# Patient Record
Sex: Female | Born: 1985 | Race: White | Hispanic: No | Marital: Single | Smoking: Never smoker
Health system: Southern US, Community
[De-identification: ages and names within clinical notes are randomized; demographics above are authoritative.]

## PROBLEM LIST (undated history)

## (undated) DIAGNOSIS — G43909 Migraine, unspecified, not intractable, without status migrainosus: Secondary | ICD-10-CM

## (undated) DIAGNOSIS — M5136 Other intervertebral disc degeneration, lumbar region: Secondary | ICD-10-CM

## (undated) DIAGNOSIS — M51369 Other intervertebral disc degeneration, lumbar region without mention of lumbar back pain or lower extremity pain: Secondary | ICD-10-CM

## (undated) HISTORY — PX: TYMPANOSTOMY TUBE PLACEMENT: SHX32

---

## 2012-10-12 ENCOUNTER — Encounter (HOSPITAL_COMMUNITY): Payer: Self-pay | Admitting: *Deleted

## 2012-10-12 ENCOUNTER — Emergency Department (HOSPITAL_COMMUNITY)
Admission: EM | Admit: 2012-10-12 | Discharge: 2012-10-12 | Disposition: A | Discharge status: Home / Self Care | Payer: 59 | Attending: Emergency Medicine | Admitting: Emergency Medicine

## 2012-10-12 ENCOUNTER — Emergency Department (HOSPITAL_COMMUNITY): Payer: 59

## 2012-10-12 DIAGNOSIS — S139XXA Sprain of joints and ligaments of unspecified parts of neck, initial encounter: Secondary | ICD-10-CM | POA: Insufficient documentation

## 2012-10-12 DIAGNOSIS — Z8679 Personal history of other diseases of the circulatory system: Secondary | ICD-10-CM | POA: Insufficient documentation

## 2012-10-12 DIAGNOSIS — R42 Dizziness and giddiness: Secondary | ICD-10-CM | POA: Insufficient documentation

## 2012-10-12 DIAGNOSIS — Y9389 Activity, other specified: Secondary | ICD-10-CM | POA: Insufficient documentation

## 2012-10-12 DIAGNOSIS — R112 Nausea with vomiting, unspecified: Secondary | ICD-10-CM | POA: Insufficient documentation

## 2012-10-12 DIAGNOSIS — Y9289 Other specified places as the place of occurrence of the external cause: Secondary | ICD-10-CM | POA: Insufficient documentation

## 2012-10-12 DIAGNOSIS — S0993XA Unspecified injury of face, initial encounter: Secondary | ICD-10-CM | POA: Insufficient documentation

## 2012-10-12 DIAGNOSIS — S0990XA Unspecified injury of head, initial encounter: Secondary | ICD-10-CM

## 2012-10-12 DIAGNOSIS — R0602 Shortness of breath: Secondary | ICD-10-CM | POA: Insufficient documentation

## 2012-10-12 DIAGNOSIS — W1809XA Striking against other object with subsequent fall, initial encounter: Secondary | ICD-10-CM | POA: Insufficient documentation

## 2012-10-12 DIAGNOSIS — S161XXA Strain of muscle, fascia and tendon at neck level, initial encounter: Secondary | ICD-10-CM

## 2012-10-12 HISTORY — DX: Migraine, unspecified, not intractable, without status migrainosus: G43.909

## 2012-10-12 MED ORDER — METOCLOPRAMIDE HCL 10 MG PO TABS
10.0000 mg | ORAL_TABLET | Freq: Once | ORAL | Status: AC
  Administered 2012-10-12: 10 mg via ORAL
  Filled 2012-10-12: qty 1

## 2012-10-12 MED ORDER — METHOCARBAMOL 500 MG PO TABS: 500.0000 mg | ORAL_TABLET | Freq: Two times a day (BID) | ORAL | Status: AC

## 2012-10-12 MED ORDER — IBUPROFEN 400 MG PO TABS: 400.0000 mg | ORAL_TABLET | Freq: Four times a day (QID) | ORAL | Status: AC | PRN

## 2012-10-12 MED ORDER — IBUPROFEN 200 MG PO TABS
400.0000 mg | ORAL_TABLET | Freq: Once | ORAL | Status: AC
  Administered 2012-10-12: 400 mg via ORAL
  Filled 2012-10-12: qty 2

## 2012-10-12 MED ORDER — METOCLOPRAMIDE HCL 10 MG PO TABS: 10.0000 mg | ORAL_TABLET | Freq: Four times a day (QID) | ORAL | Status: AC | PRN

## 2012-10-12 MED ORDER — TRAMADOL HCL 50 MG PO TABS: 50.0000 mg | ORAL_TABLET | Freq: Four times a day (QID) | ORAL | Status: AC | PRN

## 2012-10-12 NOTE — ED Provider Notes (Signed)
History     CSN: [id number]  Arrival date & time 10/12/12  1716   First MD Initiated Contact with Patient 10/12/12 1758      Chief Complaint  Patient presents with  . Fall    (Consider location/radiation/quality/duration/timing/severity/associated sxs/prior treatment) HPI Pt slipped on ice and fell from standing striking the back of her head. No LOC. Since the fall she has been nauseated and vomited once. +dizziness. +posterior neck pain. No focal weakness or numbness.  Past Medical History  Diagnosis Date  . Migraine     Past Surgical History  Procedure Laterality Date  . Tympanostomy tube placement      History reviewed. No pertinent family history.  History  Substance Use Topics  . Smoking status: Not on file  . Smokeless tobacco: Not on file  . Alcohol Use: No    OB History   Grav Para Term Preterm Abortions TAB SAB Ect Mult Living                  Review of Systems  Constitutional: Negative for fever and chills.  HENT: Positive for neck pain. Negative for facial swelling and neck stiffness.   Eyes: Negative for visual disturbance.  Respiratory: Positive for shortness of breath.   Cardiovascular: Negative for chest pain.  Gastrointestinal: Positive for nausea and vomiting. Negative for abdominal pain, diarrhea and constipation.  Musculoskeletal: Positive for myalgias. Negative for back pain.  Skin: Negative for rash and wound.  Neurological: Positive for dizziness, light-headedness and headaches. Negative for syncope, weakness and numbness.  All other systems reviewed and are negative.    Allergies  Review of patient's allergies indicates no known allergies.  Home Medications   Current Outpatient Rx  Name  Route  Sig  Dispense  Refill  . ibuprofen (ADVIL,MOTRIN) 400 MG tablet   Oral   Take 1 tablet (400 mg total) by mouth every 6 (six) hours as needed for pain.   30 tablet   0   . methocarbamol (ROBAXIN) 500 MG tablet   Oral   Take 1 tablet  (500 mg total) by mouth 2 (two) times daily.   20 tablet   0   . metoCLOPramide (REGLAN) 10 MG tablet   Oral   Take 1 tablet (10 mg total) by mouth every 6 (six) hours as needed (nausea/headache).   6 tablet   0   . traMADol (ULTRAM) 50 MG tablet   Oral   Take 1 tablet (50 mg total) by mouth every 6 (six) hours as needed for pain.   15 tablet   0     BP 102/74  Pulse 78  Temp(Src) 98.4 F (36.9 C) (Oral)  Resp 18  Ht 5\' 4"  (1.626 m)  Wt 185 lb (83.915 kg)  BMI 31.74 kg/m2  SpO2 100%  LMP 10/03/2012  Physical Exam  Nursing note and vitals reviewed. Constitutional: She is oriented to person, place, and time. She appears well-developed and well-nourished. No distress.  HENT:  Head: Normocephalic.  Mouth/Throat: Oropharynx is clear and moist.  TTP over posterior scalp without definite deformity or swelling  Eyes: EOM are normal. Pupils are equal, round, and reactive to light.  Neck: Normal range of motion. Neck supple.  TTP over midline cervical spine and left paraspinal muscles  Cardiovascular: Normal rate and regular rhythm.   Pulmonary/Chest: Effort normal and breath sounds normal. No respiratory distress. She has no wheezes. She has no rales.  Abdominal: Soft. Bowel sounds are normal. She exhibits no  mass. There is no tenderness. There is no rebound and no guarding.  Musculoskeletal: Normal range of motion. She exhibits no edema and no tenderness.  Neurological: She is alert and oriented to person, place, and time.  5/5 motor in all ext, sensation intact  Skin: Skin is warm and dry. No rash noted. No erythema.  Psychiatric: She has a normal mood and affect. Her behavior is normal.    ED Course  Procedures (including critical care time)  Labs Reviewed - No data to display Ct Head Wo Contrast  10/12/2012  *RADIOLOGY REPORT*  Clinical Data:  Larey Seat in parking neck, head pain, neck pain, vomiting.  CT HEAD WITHOUT CONTRAST CT CERVICAL SPINE WITHOUT CONTRAST  Technique:   Multidetector CT imaging of the head and cervical spine was performed following the standard protocol without intravenous contrast.  Multiplanar CT image reconstructions of the cervical spine were also generated.  Comparison:   None  CT HEAD  Findings: There is no evidence for acute infarction, intracranial hemorrhage, mass lesion, hydrocephalus, or extra-axial fluid. There is no atrophy or white matter disease.  Calvarium is intact. No acute sinus or mastoid fluid.  IMPRESSION: Negative exam.  CT CERVICAL SPINE  Findings: No visible cervical spine fracture or traumatic subluxation. Anatomic alignment.  No prevertebral soft tissue swelling.  No neck masses.  Lung apices clear.  IMPRESSION: Negative exam.   Original Report Authenticated By: Davonna Belling, M.D.    Ct Cervical Spine Wo Contrast  10/12/2012  *RADIOLOGY REPORT*  Clinical Data:  Larey Seat in parking neck, head pain, neck pain, vomiting.  CT HEAD WITHOUT CONTRAST CT CERVICAL SPINE WITHOUT CONTRAST  Technique:  Multidetector CT imaging of the head and cervical spine was performed following the standard protocol without intravenous contrast.  Multiplanar CT image reconstructions of the cervical spine were also generated.  Comparison:   None  CT HEAD  Findings: There is no evidence for acute infarction, intracranial hemorrhage, mass lesion, hydrocephalus, or extra-axial fluid. There is no atrophy or white matter disease.  Calvarium is intact. No acute sinus or mastoid fluid.  IMPRESSION: Negative exam.  CT CERVICAL SPINE  Findings: No visible cervical spine fracture or traumatic subluxation. Anatomic alignment.  No prevertebral soft tissue swelling.  No neck masses.  Lung apices clear.  IMPRESSION: Negative exam.   Original Report Authenticated By: Davonna Belling, M.D.      1. Closed head injury, initial encounter   2. Cervical strain, initial encounter       MDM   Pt given head injury precautions.        Loren Racer, MD 10/12/12 2116

## 2012-10-12 NOTE — ED Notes (Signed)
MD at bedside. 

## 2012-10-12 NOTE — ED Notes (Signed)
Pt reports slipping on ice in parking deck. Reports hitting head. Pt reports dizziness, HA and vomiting after fall. Denies LOC.

## 2013-11-10 IMAGING — CT CT HEAD W/O CM
4 series · 17 of 30 positions shown, 19 images · non-contrast
Comparison: None

CT HEAD

CLINICAL DATA: Fell in parking neck, head pain, neck pain,
vomiting.

CT HEAD WITHOUT CONTRAST
CT CERVICAL SPINE WITHOUT CONTRAST
TECHNIQUE: Multidetector CT imaging of the head and cervical spine
was performed following the standard protocol without intravenous
contrast.  Multiplanar CT image reconstructions of the cervical
spine were also generated.

[Series 2: head w/o · axial · non-contrast · 0.45mm/px · z∈[-149,-104]mm · 2 of 29 slices shown]
[im 10/29  brain]
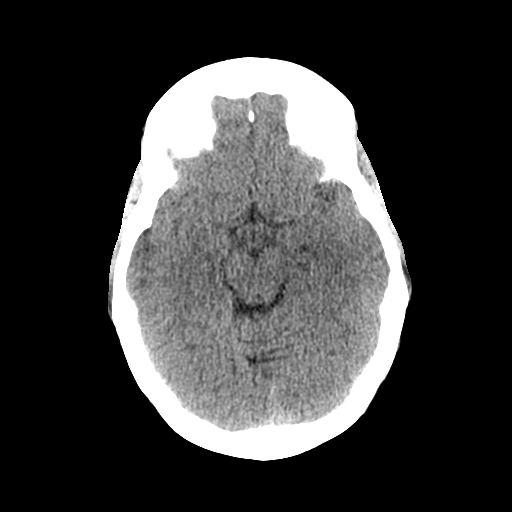
[im 19/29  brain]
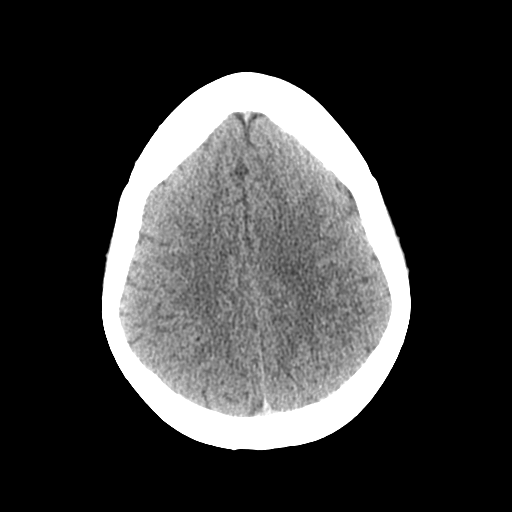

[Series 3: bone windows · axial · 0.45mm/px · z∈[-167,-86]mm · 4 of 47 slices shown]
[im 10/47  bone]
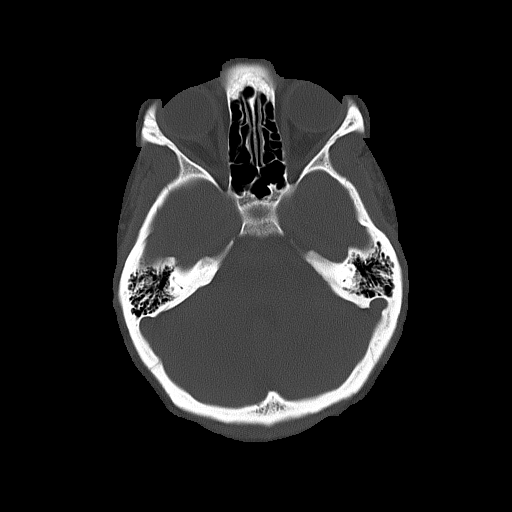
[im 19/47  bone]
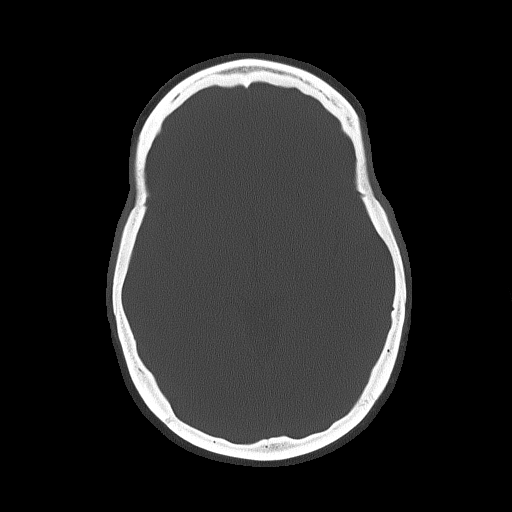
[im 28/47  bone]
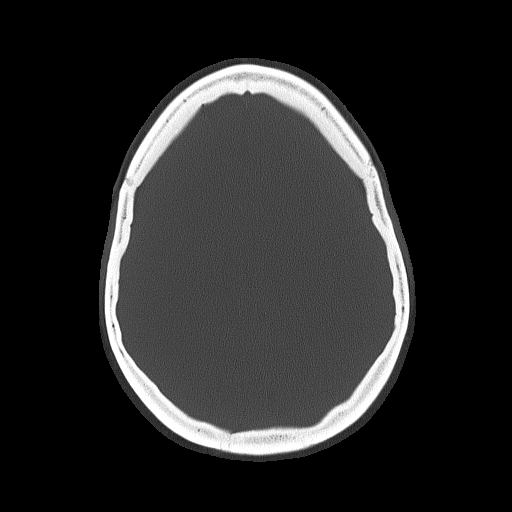
[im 37/47  bone]
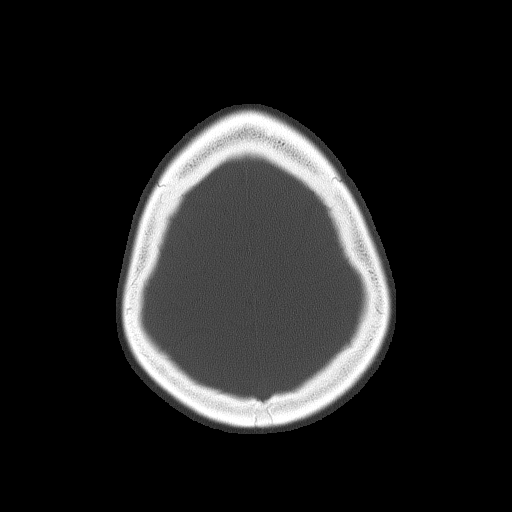

[Series 4: c-spine st · axial · 0.31mm/px · z∈[-318,-286]mm · 3 of 74 slices shown]
[im 9/74  brain]
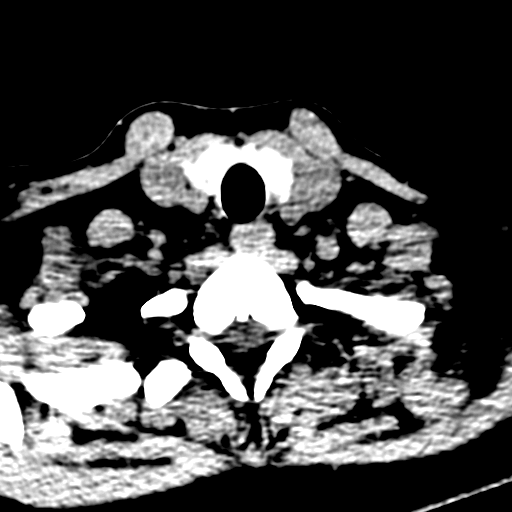
[im 17/74  brain]
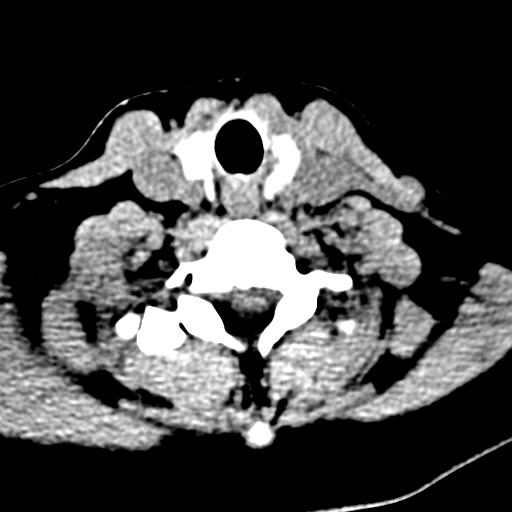
[im 25/74  brain]
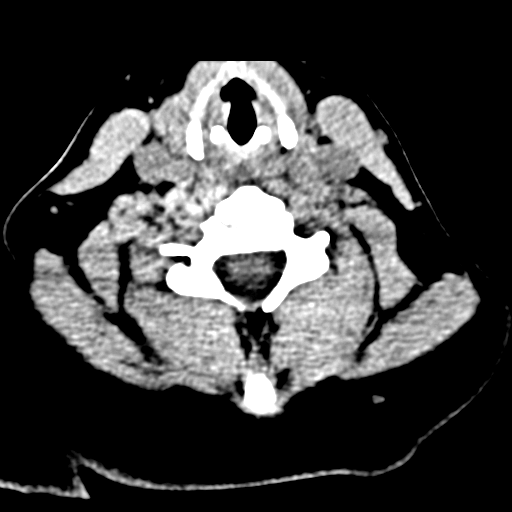

[Series 9: axial recon · axial · 0.23mm/px · z∈[-344,-240]mm · 8 of 74 slices shown, 10 images]
[im 9/74  brain]
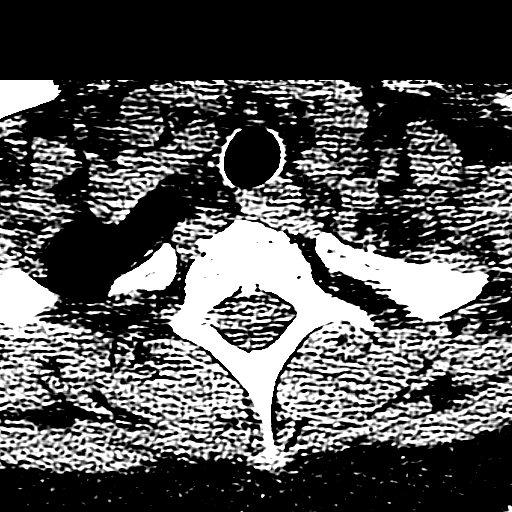
[im 9/74  bone]
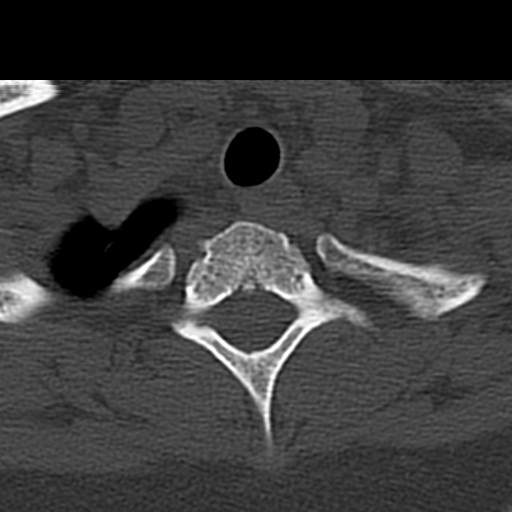
[im 17/74  brain]
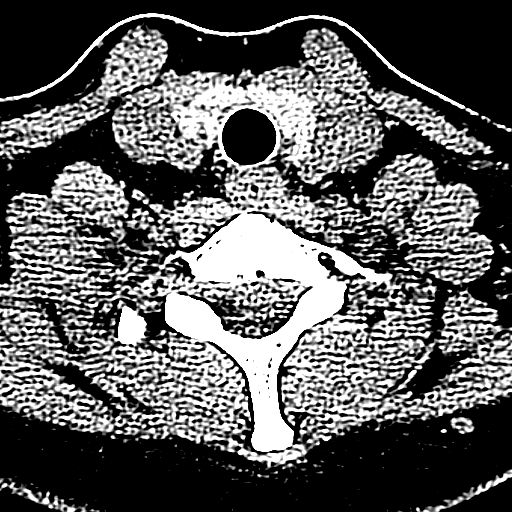
[im 25/74  brain]
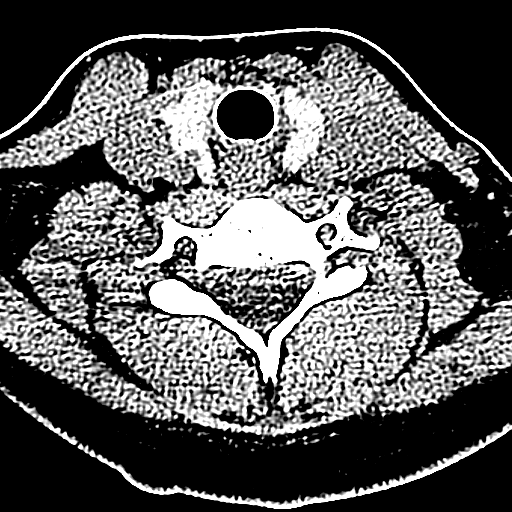
[im 33/74  brain]
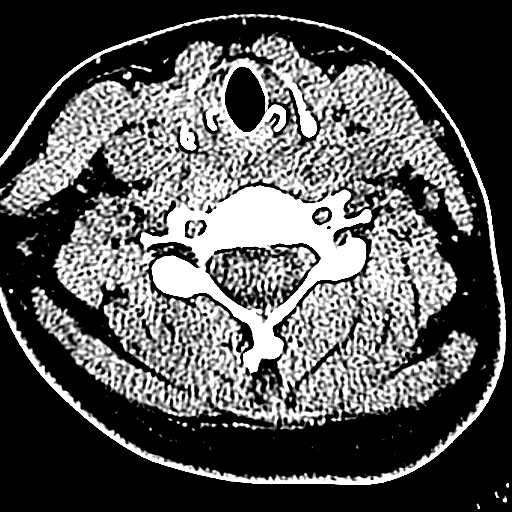
[im 41/74  brain]
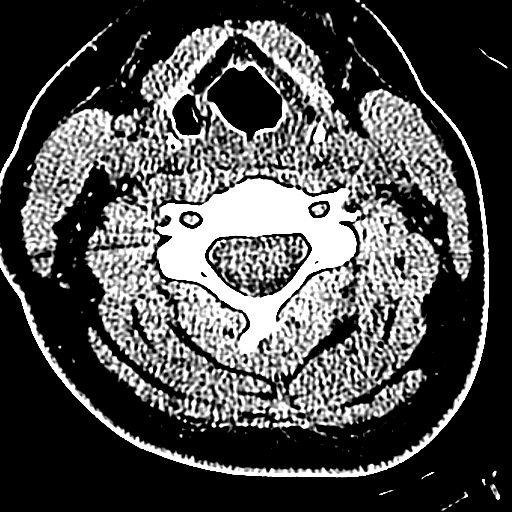
[im 41/74  bone]
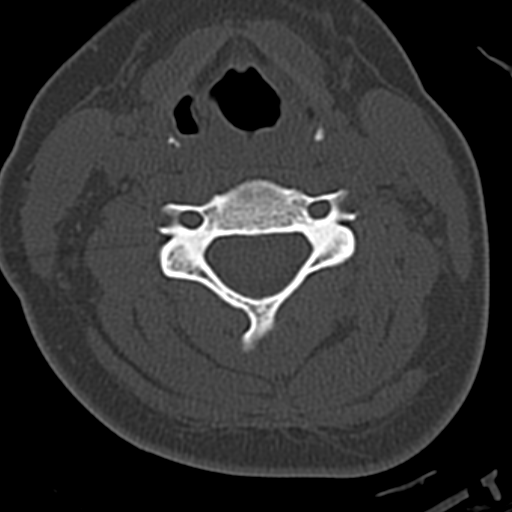
[im 49/74  brain]
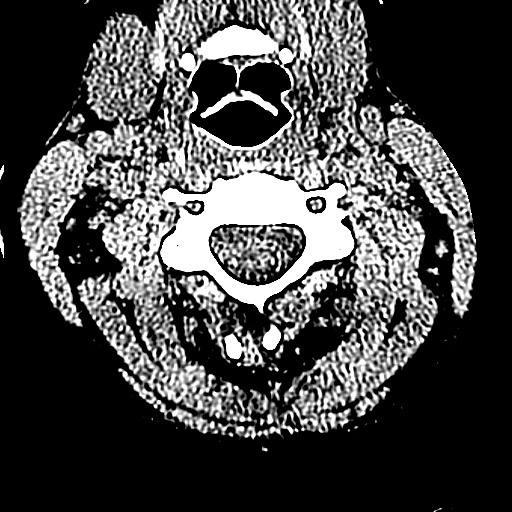
[im 57/74  brain]
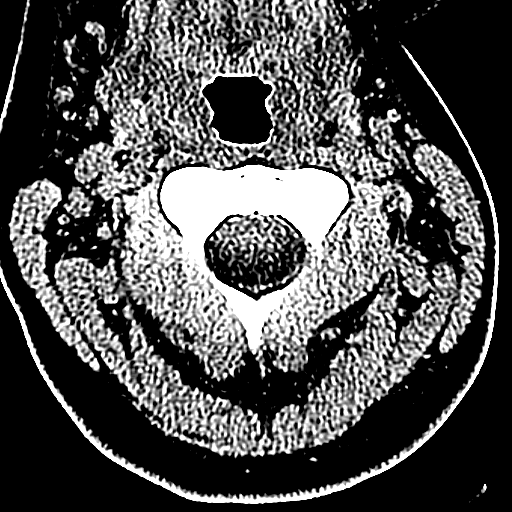
[im 65/74  brain]
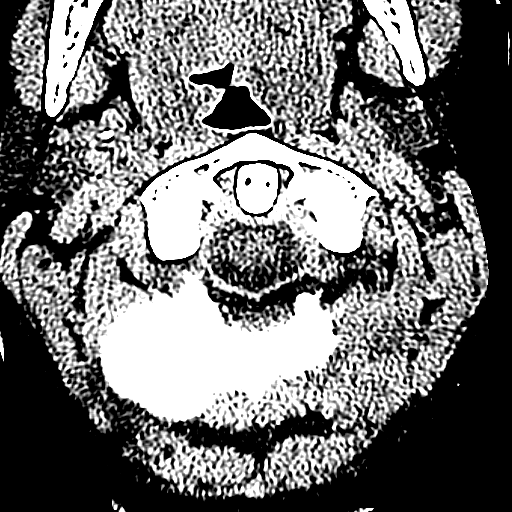

[17 of 30 positions shown; findings below may reference images not displayed]

FINDINGS: There is no evidence for acute infarction, intracranial
hemorrhage, mass lesion, hydrocephalus, or extra-axial fluid.
There is no atrophy or white matter disease.  Calvarium is intact.
No acute sinus or mastoid fluid.
IMPRESSION: Negative exam.

CT CERVICAL SPINE
FINDINGS: No visible cervical spine fracture or traumatic
subluxation. Anatomic alignment.  No prevertebral soft tissue
swelling.  No neck masses.  Lung apices clear.
IMPRESSION: Negative exam.

## 2015-08-22 DIAGNOSIS — F321 Major depressive disorder, single episode, moderate: Secondary | ICD-10-CM | POA: Diagnosis not present

## 2015-09-21 DIAGNOSIS — Z01812 Encounter for preprocedural laboratory examination: Secondary | ICD-10-CM | POA: Diagnosis not present

## 2015-09-21 DIAGNOSIS — Z30017 Encounter for initial prescription of implantable subdermal contraceptive: Secondary | ICD-10-CM | POA: Diagnosis not present

## 2015-09-21 DIAGNOSIS — H20011 Primary iridocyclitis, right eye: Secondary | ICD-10-CM | POA: Diagnosis not present

## 2015-10-19 DIAGNOSIS — K5904 Chronic idiopathic constipation: Secondary | ICD-10-CM | POA: Diagnosis not present

## 2015-11-02 DIAGNOSIS — Z975 Presence of (intrauterine) contraceptive device: Secondary | ICD-10-CM | POA: Diagnosis not present

## 2015-11-06 DIAGNOSIS — K5904 Chronic idiopathic constipation: Secondary | ICD-10-CM | POA: Diagnosis not present

## 2015-12-19 DIAGNOSIS — K5904 Chronic idiopathic constipation: Secondary | ICD-10-CM | POA: Diagnosis not present

## 2016-01-02 DIAGNOSIS — K5904 Chronic idiopathic constipation: Secondary | ICD-10-CM | POA: Diagnosis not present

## 2016-01-14 DIAGNOSIS — K5904 Chronic idiopathic constipation: Secondary | ICD-10-CM | POA: Diagnosis not present

## 2016-05-29 DIAGNOSIS — L719 Rosacea, unspecified: Secondary | ICD-10-CM | POA: Diagnosis not present

## 2016-05-29 DIAGNOSIS — D1801 Hemangioma of skin and subcutaneous tissue: Secondary | ICD-10-CM | POA: Diagnosis not present

## 2016-05-29 DIAGNOSIS — L2082 Flexural eczema: Secondary | ICD-10-CM | POA: Diagnosis not present

## 2016-05-29 DIAGNOSIS — L579 Skin changes due to chronic exposure to nonionizing radiation, unspecified: Secondary | ICD-10-CM | POA: Diagnosis not present

## 2016-06-12 DIAGNOSIS — J019 Acute sinusitis, unspecified: Secondary | ICD-10-CM | POA: Diagnosis not present

## 2016-06-12 DIAGNOSIS — B9689 Other specified bacterial agents as the cause of diseases classified elsewhere: Secondary | ICD-10-CM | POA: Diagnosis not present

## 2016-06-30 DIAGNOSIS — J0100 Acute maxillary sinusitis, unspecified: Secondary | ICD-10-CM | POA: Diagnosis not present

## 2016-06-30 DIAGNOSIS — H6691 Otitis media, unspecified, right ear: Secondary | ICD-10-CM | POA: Diagnosis not present

## 2016-10-03 DIAGNOSIS — M5416 Radiculopathy, lumbar region: Secondary | ICD-10-CM | POA: Diagnosis not present

## 2016-10-09 DIAGNOSIS — Z3046 Encounter for surveillance of implantable subdermal contraceptive: Secondary | ICD-10-CM | POA: Diagnosis not present

## 2016-10-09 DIAGNOSIS — N926 Irregular menstruation, unspecified: Secondary | ICD-10-CM | POA: Diagnosis not present

## 2016-12-25 DIAGNOSIS — G4720 Circadian rhythm sleep disorder, unspecified type: Secondary | ICD-10-CM | POA: Diagnosis not present

## 2017-01-14 DIAGNOSIS — L579 Skin changes due to chronic exposure to nonionizing radiation, unspecified: Secondary | ICD-10-CM | POA: Diagnosis not present

## 2017-01-14 DIAGNOSIS — Q829 Congenital malformation of skin, unspecified: Secondary | ICD-10-CM | POA: Diagnosis not present

## 2017-01-14 DIAGNOSIS — L853 Xerosis cutis: Secondary | ICD-10-CM | POA: Diagnosis not present

## 2017-01-14 DIAGNOSIS — L981 Factitial dermatitis: Secondary | ICD-10-CM | POA: Diagnosis not present

## 2017-01-14 DIAGNOSIS — L2082 Flexural eczema: Secondary | ICD-10-CM | POA: Diagnosis not present

## 2017-05-12 DIAGNOSIS — Z23 Encounter for immunization: Secondary | ICD-10-CM | POA: Diagnosis not present

## 2017-05-29 DIAGNOSIS — M25774 Osteophyte, right foot: Secondary | ICD-10-CM | POA: Diagnosis not present

## 2018-07-26 DIAGNOSIS — R05 Cough: Secondary | ICD-10-CM | POA: Diagnosis not present

## 2018-07-26 DIAGNOSIS — J0100 Acute maxillary sinusitis, unspecified: Secondary | ICD-10-CM | POA: Diagnosis not present

## 2018-08-13 DIAGNOSIS — Z3046 Encounter for surveillance of implantable subdermal contraceptive: Secondary | ICD-10-CM | POA: Diagnosis not present

## 2018-08-20 DIAGNOSIS — J0141 Acute recurrent pansinusitis: Secondary | ICD-10-CM | POA: Diagnosis not present

## 2018-08-20 DIAGNOSIS — M26609 Unspecified temporomandibular joint disorder, unspecified side: Secondary | ICD-10-CM | POA: Diagnosis not present

## 2019-01-26 DIAGNOSIS — M2550 Pain in unspecified joint: Secondary | ICD-10-CM | POA: Diagnosis not present

## 2019-02-11 DIAGNOSIS — M2550 Pain in unspecified joint: Secondary | ICD-10-CM | POA: Diagnosis not present

## 2019-11-21 DIAGNOSIS — H9202 Otalgia, left ear: Secondary | ICD-10-CM | POA: Diagnosis not present

## 2019-11-21 DIAGNOSIS — R3915 Urgency of urination: Secondary | ICD-10-CM | POA: Diagnosis not present

## 2019-11-21 DIAGNOSIS — J359 Chronic disease of tonsils and adenoids, unspecified: Secondary | ICD-10-CM | POA: Diagnosis not present

## 2019-11-21 DIAGNOSIS — N3000 Acute cystitis without hematuria: Secondary | ICD-10-CM | POA: Diagnosis not present

## 2019-12-23 DIAGNOSIS — H93293 Other abnormal auditory perceptions, bilateral: Secondary | ICD-10-CM | POA: Diagnosis not present

## 2019-12-23 DIAGNOSIS — J358 Other chronic diseases of tonsils and adenoids: Secondary | ICD-10-CM | POA: Diagnosis not present

## 2020-05-31 DIAGNOSIS — Z20822 Contact with and (suspected) exposure to covid-19: Secondary | ICD-10-CM | POA: Diagnosis not present

## 2020-05-31 DIAGNOSIS — Z03818 Encounter for observation for suspected exposure to other biological agents ruled out: Secondary | ICD-10-CM | POA: Diagnosis not present

## 2022-01-01 DIAGNOSIS — M5441 Lumbago with sciatica, right side: Secondary | ICD-10-CM | POA: Diagnosis not present

## 2022-01-01 DIAGNOSIS — M533 Sacrococcygeal disorders, not elsewhere classified: Secondary | ICD-10-CM | POA: Diagnosis not present

## 2022-01-01 DIAGNOSIS — M7061 Trochanteric bursitis, right hip: Secondary | ICD-10-CM | POA: Diagnosis not present

## 2022-01-01 DIAGNOSIS — M5442 Lumbago with sciatica, left side: Secondary | ICD-10-CM | POA: Diagnosis not present

## 2022-01-01 DIAGNOSIS — M47816 Spondylosis without myelopathy or radiculopathy, lumbar region: Secondary | ICD-10-CM | POA: Diagnosis not present

## 2022-01-10 DIAGNOSIS — R202 Paresthesia of skin: Secondary | ICD-10-CM | POA: Diagnosis not present

## 2022-01-10 DIAGNOSIS — M5416 Radiculopathy, lumbar region: Secondary | ICD-10-CM | POA: Diagnosis not present

## 2022-01-10 DIAGNOSIS — M47816 Spondylosis without myelopathy or radiculopathy, lumbar region: Secondary | ICD-10-CM | POA: Diagnosis not present

## 2022-02-03 ENCOUNTER — Emergency Department (HOSPITAL_COMMUNITY): Payer: PRIVATE HEALTH INSURANCE

## 2022-02-03 ENCOUNTER — Emergency Department (HOSPITAL_COMMUNITY)
Admission: EM | Admit: 2022-02-03 | Discharge: 2022-02-04 | Disposition: A | Discharge status: Home / Self Care | Payer: PRIVATE HEALTH INSURANCE | Attending: Emergency Medicine | Admitting: Emergency Medicine

## 2022-02-03 ENCOUNTER — Encounter (HOSPITAL_COMMUNITY): Payer: Self-pay

## 2022-02-03 ENCOUNTER — Other Ambulatory Visit: Payer: Self-pay

## 2022-02-03 DIAGNOSIS — S99911A Unspecified injury of right ankle, initial encounter: Secondary | ICD-10-CM | POA: Diagnosis present

## 2022-02-03 DIAGNOSIS — W228XXA Striking against or struck by other objects, initial encounter: Secondary | ICD-10-CM | POA: Diagnosis not present

## 2022-02-03 DIAGNOSIS — S93401A Sprain of unspecified ligament of right ankle, initial encounter: Secondary | ICD-10-CM | POA: Diagnosis not present

## 2022-02-03 HISTORY — DX: Other intervertebral disc degeneration, lumbar region without mention of lumbar back pain or lower extremity pain: M51.369

## 2022-02-03 HISTORY — DX: Other intervertebral disc degeneration, lumbar region: M51.36

## 2022-02-03 MED ORDER — IBUPROFEN 400 MG PO TABS
400.0000 mg | ORAL_TABLET | Freq: Once | ORAL | Status: AC
  Administered 2022-02-04: 400 mg via ORAL
  Filled 2022-02-03: qty 1

## 2022-02-03 NOTE — ED Provider Triage Note (Signed)
Emergency Medicine Provider Triage Evaluation Note  Whitney Vazquez , a 36 y.o. female  was evaluated in triage.  Pt complains of right ankle pain.  She states that around 10 PM tonight she tripped over a chair leg and rolled her right ankle.  Began having swelling afterward.  She complains of moderate pain to the lateral part of the ankle.  No previous surgeries.  No knee pain..  Review of Systems  Positive: See above Negative:   Physical Exam  BP 129/84 (BP Location: Right Arm)   Pulse 80   Temp 98.3 F (36.8 C) (Oral)   Resp 18   Ht '5\' 4"'$  (1.626 m)   Wt 84.8 kg   LMP 01/31/2022 (Exact Date)   SpO2 99%   BMI 32.10 kg/m  Gen:   Awake, no distress   Resp:  Normal effort  MSK:   Moves extremities without difficulty  Other:  Swelling noted to the lateral malleolus, sensation intact, DP pulse 1+.  Cap refill less than 2 seconds.  Able to flex and extend the right ankle without difficulty.  Medical Decision Making  Medically screening exam initiated at 11:08 PM.  Appropriate orders placed.  Shadavia Dampier was informed that the remainder of the evaluation will be completed by another provider, this initial triage assessment does not replace that evaluation, and the importance of remaining in the ED until their evaluation is complete.     Mickie Hillier, PA-C 02/03/22 2309

## 2022-02-03 NOTE — ED Triage Notes (Signed)
Pt reports she tripped over a chair leg, has pain to right ankle. + pedal pulse, sensation, able to wiggle toes. Mild swelling noted to lateral right ankle.

## 2022-02-04 NOTE — Discharge Instructions (Addendum)
You were seen in the emergency department today for ankle swelling.  You do not have a fracture or dislocation.  We provided you with a Aircast brace which you can use for comfort.  Continue to rest, use ice multiple times a day for 15 to 20 minutes at a time, you may use ibuprofen or Tylenol as needed for pain and elevate the leg when you are resting.  Please return to emergency department if you are unable to bear weight.

## 2022-02-04 NOTE — ED Provider Notes (Signed)
[facility] EMERGENCY DEPARTMENT Provider Note   CSN: [id number] Arrival date & time: 02/03/22  2251     History  Chief Complaint  Patient presents with   Ankle Pain    Whitney Vazquez is a 36 y.o. female.  With past medical history of migraines who presents to the emergency department with right ankle pain.  States that she was at home this evening when she hit the corner of a chair with her right ankle.  Believes she also rolled her right ankle but is unsure.  She denies falling.  States that since then she has had progressive swelling of the lateral ankle and pain.  Denies numbness or tingling to the foot.  Denies pain to the knee.  Denies previous surgery on the right ankle.   Ankle Pain      Home Medications Prior to Admission medications   Medication Sig Start Date End Date Taking? Authorizing Provider  ibuprofen (ADVIL,MOTRIN) 400 MG tablet Take 1 tablet (400 mg total) by mouth every 6 (six) hours as needed for pain. 10/12/12   Julianne Rice, MD  methocarbamol (ROBAXIN) 500 MG tablet Take 1 tablet (500 mg total) by mouth 2 (two) times daily. 10/12/12   Julianne Rice, MD  metoCLOPramide (REGLAN) 10 MG tablet Take 1 tablet (10 mg total) by mouth every 6 (six) hours as needed (nausea/headache). 10/12/12   Julianne Rice, MD  traMADol (ULTRAM) 50 MG tablet Take 1 tablet (50 mg total) by mouth every 6 (six) hours as needed for pain. 10/12/12   Julianne Rice, MD      Allergies    Patient has no known allergies.    Review of Systems   Review of Systems  Musculoskeletal:  Positive for arthralgias and joint swelling.  All other systems reviewed and are negative.   Physical Exam Updated Vital Signs BP 129/84 (BP Location: Right Arm)   Pulse 80   Temp 98.3 F (36.8 C) (Oral)   Resp 18   Ht '5\' 4"'$  (1.626 m)   Wt 84.8 kg   LMP 01/31/2022 (Exact Date)   SpO2 99%   BMI 32.10 kg/m  Physical Exam Vitals and nursing note reviewed.  Constitutional:       General: She is not in acute distress.    Appearance: Normal appearance. She is not ill-appearing.  HENT:     Head: Normocephalic and atraumatic.  Eyes:     General: No scleral icterus. Cardiovascular:     Rate and Rhythm: Normal rate and regular rhythm.     Pulses: Normal pulses.  Pulmonary:     Effort: Pulmonary effort is normal.  Musculoskeletal:        General: Swelling, tenderness and signs of injury present. No deformity.       Legs:     Comments: Swelling to the lateral malleolus with ecchymoses DP pulse 1+, sensation intact, cap refill less than 2 seconds  Skin:    General: Skin is warm and dry.     Capillary Refill: Capillary refill takes less than 2 seconds.     Coloration: Skin is not pale.     Findings: Bruising present.  Neurological:     General: No focal deficit present.     Mental Status: She is alert.  Psychiatric:        Mood and Affect: Mood normal.        Behavior: Behavior normal.     ED Results / Procedures / Treatments   Labs (all labs ordered  are listed, but only abnormal results are displayed) Labs Reviewed - No data to display  EKG None  Radiology DG Ankle Complete Right  Result Date: 02/03/2022 CLINICAL DATA:  Right ankle pain and swelling after injury, fall. EXAM: RIGHT ANKLE - COMPLETE 3+ VIEW COMPARISON:  None Available. FINDINGS: There is no evidence of fracture, dislocation, or joint effusion. Ankle mortise is preserved. Intact talar dome. Intact base of the fifth metatarsal. There is no evidence of arthropathy or other focal bone abnormality. Soft tissue edema is most prominent laterally. IMPRESSION: Soft tissue edema. No fracture or dislocation. Electronically Signed   By: Keith Rake M.D.   On: 02/03/2022 23:50    Procedures Procedures   Medications Ordered in ED Medications  ibuprofen (ADVIL) tablet 400 mg (has no administration in time range)    ED Course/ Medical Decision Making/ A&P                            Medical Decision Making Risk Prescription drug management.  This patient presents to the ED for concern of ankle swelling, this involves an extensive number of treatment options, and is a complaint that carries with it a high risk of complications and morbidity.  The differential diagnosis includes fracture, sprain  Co morbidities that complicate the patient evaluation None  Additional history obtained:  Additional history obtained from: None available External records from outside source obtained and reviewed including: Most recent PT note  Imaging Studies ordered:  I ordered imaging studies which included x-ray.  I independently reviewed & interpreted imaging & am in agreement with radiology impression. Imaging shows: X-ray of the right ankle shows soft tissue edema without evidence of fracture or dislocation  Medications  I ordered medication including ibuprofen for pain Reevaluation of the patient after medication shows that patient stayed the same -I reviewed the patient's home medications and did not make adjustments. -I did not prescribe new home medications.   Critical Interventions: Aircast  SDH None identified  ED Course:  36 year old female who presents to the emergency department with right ankle swelling.  Physical exam as noted above.  Likely a ligamentous sprain.  Low suspicion for fracture, dislocation, septic arthritis, gouty flare, arthritis.  Imaging without evidence of fracture or dislocation.  She is weightbearing.  Provided with a Aircast brace.  Instructed to use NSAIDs, rest, ice and elevating.  Given return precautions if unable to bear weight.  She verbalized understanding.  After consideration of the diagnostic results and the patients response to treatment, I feel that the patent would benefit from discharge. The patient has been appropriately medically screened and/or stabilized in the ED. I have low suspicion for any other emergent medical  condition which would require further screening, evaluation or treatment in the ED or require inpatient management. The patient is overall well appearing and non-toxic in appearance. They are hemodynamically stable at time of discharge.   Final Clinical Impression(s) / ED Diagnoses Final diagnoses:  Sprain of right ankle, unspecified ligament, initial encounter    Rx / DC Orders ED Discharge Orders     None         Mickie Hillier, PA-C 02/04/22 0050    Dina Rich Barbette Hair, MD 02/04/22 [phone]

## 2022-02-04 NOTE — Progress Notes (Signed)
Orthopedic Tech Progress Note Patient Details:  Whitney Vazquez 1985/12/07 [id number]  Ortho Devices Type of Ortho Device: Ankle Air splint Ortho Device/Splint Location: rle Ortho Device/Splint Interventions: Ordered, Application, Adjustment   Post Interventions Patient Tolerated: Well Instructions Provided: Care of device, Adjustment of device  Karolee Stamps 02/04/2022, 12:56 AM

## 2022-02-11 ENCOUNTER — Ambulatory Visit: Payer: Self-pay

## 2022-02-11 ENCOUNTER — Other Ambulatory Visit: Payer: Self-pay | Admitting: Family Medicine

## 2022-02-11 DIAGNOSIS — M25571 Pain in right ankle and joints of right foot: Secondary | ICD-10-CM

## 2022-02-11 DIAGNOSIS — M79671 Pain in right foot: Secondary | ICD-10-CM

## 2022-02-13 DIAGNOSIS — M461 Sacroiliitis, not elsewhere classified: Secondary | ICD-10-CM | POA: Diagnosis not present

## 2022-02-13 DIAGNOSIS — M545 Low back pain, unspecified: Secondary | ICD-10-CM | POA: Diagnosis not present

## 2022-02-13 DIAGNOSIS — M7061 Trochanteric bursitis, right hip: Secondary | ICD-10-CM | POA: Diagnosis not present

## 2022-02-13 DIAGNOSIS — G8929 Other chronic pain: Secondary | ICD-10-CM | POA: Diagnosis not present

## 2022-02-13 DIAGNOSIS — M47816 Spondylosis without myelopathy or radiculopathy, lumbar region: Secondary | ICD-10-CM | POA: Diagnosis not present

## 2022-03-27 DIAGNOSIS — E789 Disorder of lipoprotein metabolism, unspecified: Secondary | ICD-10-CM | POA: Diagnosis not present

## 2022-03-27 DIAGNOSIS — Z79899 Other long term (current) drug therapy: Secondary | ICD-10-CM | POA: Diagnosis not present

## 2022-03-27 DIAGNOSIS — Z13220 Encounter for screening for lipoid disorders: Secondary | ICD-10-CM | POA: Diagnosis not present

## 2022-03-27 DIAGNOSIS — Z1329 Encounter for screening for other suspected endocrine disorder: Secondary | ICD-10-CM | POA: Diagnosis not present

## 2022-03-27 DIAGNOSIS — Z130 Encounter for screening for diseases of the blood and blood-forming organs and certain disorders involving the immune mechanism: Secondary | ICD-10-CM | POA: Diagnosis not present

## 2022-03-27 DIAGNOSIS — Z0000 Encounter for general adult medical examination without abnormal findings: Secondary | ICD-10-CM | POA: Diagnosis not present

## 2022-03-27 DIAGNOSIS — Z13228 Encounter for screening for other metabolic disorders: Secondary | ICD-10-CM | POA: Diagnosis not present

## 2022-03-28 ENCOUNTER — Other Ambulatory Visit: Payer: Self-pay

## 2022-03-28 ENCOUNTER — Encounter (HOSPITAL_BASED_OUTPATIENT_CLINIC_OR_DEPARTMENT_OTHER): Payer: Self-pay

## 2022-03-28 ENCOUNTER — Emergency Department (HOSPITAL_BASED_OUTPATIENT_CLINIC_OR_DEPARTMENT_OTHER): Payer: PRIVATE HEALTH INSURANCE

## 2022-03-28 ENCOUNTER — Emergency Department (HOSPITAL_BASED_OUTPATIENT_CLINIC_OR_DEPARTMENT_OTHER)
Admission: EM | Admit: 2022-03-28 | Discharge: 2022-03-28 | Disposition: A | Discharge status: Home / Self Care | Payer: PRIVATE HEALTH INSURANCE | Attending: Emergency Medicine | Admitting: Emergency Medicine

## 2022-03-28 DIAGNOSIS — S0990XA Unspecified injury of head, initial encounter: Secondary | ICD-10-CM | POA: Insufficient documentation

## 2022-03-28 DIAGNOSIS — W010XXA Fall on same level from slipping, tripping and stumbling without subsequent striking against object, initial encounter: Secondary | ICD-10-CM | POA: Insufficient documentation

## 2022-03-28 DIAGNOSIS — Y990 Civilian activity done for income or pay: Secondary | ICD-10-CM | POA: Diagnosis not present

## 2022-03-28 NOTE — ED Triage Notes (Signed)
Patient here POV from Home.  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   Not Evaluated Then but still endorses Headaches and Swelling to Head. Mild Nausea Intermittently. No Photosensitivity.   No LOC. No Anticoagulants. Referred by [facility] for [place].   NAD Noted during Triage. A&Ox4. GCS 15. Ambulatory.

## 2022-03-28 NOTE — Discharge Instructions (Signed)
You were seen in the emergency room for evaluation of your recurring headaches after sudden fall and head injury.  Your CT was unremarkable.  I likely think you are experiencing some postconcussive syndrome.  I would like for you to follow-up with concussion clinic.  The information is listed on this discharge paperwork.  Please call to schedule an appointment.  Otherwise, please follow-up with your Worker's Comp. provider for additional instructions.  You can take Tylenol or ibuprofen as needed for pain.  You have any concern, new or worsening symptoms, please return to the nearest emergency department for evaluation.  Get help right away if: You have: A very bad headache that is not helped by medicine. Trouble walking or weakness in your arms and legs. Clear or bloody fluid coming from your nose or ears. Changes in how you see (vision). A seizure. More confusion or more grumpy moods. Your symptoms get worse. You are sleepier than normal and have trouble staying awake. You lose your balance. The black centers of your eyes (pupils) change in size. Your speech is slurred. Your dizziness gets worse. You vomit. These symptoms may be an emergency. Do not wait to see if the symptoms will go away. Get medical help right away. Call your local emergency services (911 in the [place] Do not drive yourself to the hospital.

## 2022-03-28 NOTE — ED Provider Notes (Signed)
[facility] EMERGENCY DEPT Provider Note   CSN: [id number] Arrival date & time: 03/28/22  1436     History Chief Complaint  Patient presents with   Head Injury    Whitney Vazquez is a 36 y.o. female with h/o DDD presents to the ED for evaluation of continued head pain after a trip and fall at work. On August 3rd, the patient reports that she tripped hit the left side of her face on the ground. Since then, she has continued to have recurring headaches.  She reports some occasional lightheadedness/dizziness around once a week.  Denies any new falls or injuries.  She reports she still has some swelling above her left eye.  Denies any visual changes.   Head Injury Associated symptoms: headache   Associated symptoms: no nausea and no vomiting        Home Medications Prior to Admission medications   Medication Sig Start Date End Date Taking? Authorizing Provider  ibuprofen (ADVIL,MOTRIN) 400 MG tablet Take 1 tablet (400 mg total) by mouth every 6 (six) hours as needed for pain. 10/12/12   Julianne Rice, MD  methocarbamol (ROBAXIN) 500 MG tablet Take 1 tablet (500 mg total) by mouth 2 (two) times daily. 10/12/12   Julianne Rice, MD  metoCLOPramide (REGLAN) 10 MG tablet Take 1 tablet (10 mg total) by mouth every 6 (six) hours as needed (nausea/headache). 10/12/12   Julianne Rice, MD  traMADol (ULTRAM) 50 MG tablet Take 1 tablet (50 mg total) by mouth every 6 (six) hours as needed for pain. 10/12/12   Julianne Rice, MD      Allergies    Patient has no known allergies.    Review of Systems   Review of Systems  Constitutional:  Negative for chills and fever.  HENT:  Positive for facial swelling.   Eyes:  Negative for photophobia and visual disturbance.  Gastrointestinal:  Negative for nausea and vomiting.  Neurological:  Positive for dizziness, light-headedness and headaches. Negative for syncope and weakness.    Physical Exam Updated Vital Signs BP (!) 112/59  (BP Location: Right Arm)   Pulse 79   Temp 98.3 F (36.8 C) (Oral)   Resp 16   Ht '5\' 4"'$  (1.626 m)   Wt 84.8 kg   SpO2 100%   BMI 32.09 kg/m  Physical Exam Vitals and nursing note reviewed.  Constitutional:      General: She is not in acute distress.    Appearance: Normal appearance. She is not ill-appearing or toxic-appearing.  HENT:     Head: Normocephalic.     Comments: Patient does have some mild swelling noted above her left eyebrow.  No step-offs or deformities.  No overlying skin changes or bruising noted.  No raccoon eyes or battle signs.    Right Ear: Tympanic membrane, ear canal and external ear normal.     Left Ear: Tympanic membrane, ear canal and external ear normal.  Eyes:     General: No scleral icterus. Cardiovascular:     Rate and Rhythm: Normal rate and regular rhythm.  Pulmonary:     Effort: Pulmonary effort is normal.     Breath sounds: Normal breath sounds.  Abdominal:     General: Abdomen is flat. Bowel sounds are normal.     Palpations: Abdomen is soft.  Musculoskeletal:        General: No deformity.     Cervical back: Normal range of motion. No tenderness.  Skin:    General: Skin is warm and  dry.  Neurological:     General: No focal deficit present.     Mental Status: She is alert. Mental status is at baseline.     GCS: GCS eye subscore is 4. GCS verbal subscore is 5. GCS motor subscore is 6.     Cranial Nerves: No cranial nerve deficit, dysarthria or facial asymmetry.     Sensory: No sensory deficit.     Motor: No weakness or pronator drift.     Coordination: Finger-Nose-Finger Test normal.     Gait: Gait normal.     Comments: GCS 15.  Patient answering questions appropriately with appropriate speech.  Cranial nerves II through XII grossly intact.  Strength is equal in patient's upper and lower bilateral extremities.  Sensation intact.  No pronator drift.  Normal finger-nose.  Normal gait.     ED Results / Procedures / Treatments   Labs (all  labs ordered are listed, but only abnormal results are displayed) Labs Reviewed - No data to display  EKG None  Radiology CT Head Wo Contrast  Result Date: 03/28/2022 CLINICAL DATA:  Fall, head injury EXAM: CT HEAD WITHOUT CONTRAST TECHNIQUE: Contiguous axial images were obtained from the base of the skull through the vertex without intravenous contrast. RADIATION DOSE REDUCTION: This exam was performed according to the departmental dose-optimization program which includes automated exposure control, adjustment of the mA and/or kV according to patient size and/or use of iterative reconstruction technique. COMPARISON:  10/12/2012 FINDINGS: Brain: No evidence of acute infarction, hemorrhage, hydrocephalus, extra-axial collection or mass lesion/mass effect. Vascular: No hyperdense vessel or unexpected calcification. Skull: Normal. Negative for fracture or focal lesion. Sinuses/Orbits: No acute finding. Other: None. IMPRESSION: No acute intracranial pathology. Electronically Signed   By: Delanna Ahmadi M.D.   On: 03/28/2022 15:07    Procedures Procedures   Medications Ordered in ED Medications - No data to display  ED Course/ Medical Decision Making/ A&P                           Medical Decision Making Amount and/or Complexity of Data Reviewed Radiology: ordered.   36 year old female presents the emergency department for evaluation of continued intermittent head pain for the past month after a mechanical fall and landing on the left side of her head.  Differential diagnosis includes but is not limited to bleed, fracture, trauma, postconcussive syndrome.  Vital signs are unremarkable.  Physical exam as noted above.  CT imaging ordered in triage.    CT imaging shows No acute intracranial pathology.  No acute fracture of the sinus or orbits.  I did call this with the radiologist who confirms that there is no fracture seen in the left supraorbital region.  The patient has a benign neurological  exam. With the injury being over a month ago with benign CT imaging. The patient is safe for close outpatient follow up.  I discussed the imaging with the patient.  Recommended that she follow-up with her Worker's Comp./health network providers for reevaluation and possible outpatient MRI.  We will have her follow-up with concussion clinic. Recommended Tylenol and ibuprofen as needed for pain.   We discussed return precautions and red flag symptoms.  Patient verbalized understanding agrees to the plan.  Patient is stable being discharged home in good condition.  I discussed this case with my attending physician who cosigned this note including patient's presenting symptoms, physical exam, and planned diagnostics and interventions. Attending physician stated agreement with plan  or made changes to plan which were implemented.   Attending physician assessed patient at bedside.  Final Clinical Impression(s) / ED Diagnoses Final diagnoses:  Injury of head, initial encounter    Rx / DC Orders ED Discharge Orders     None         Sherrell Puller, PA-C 03/31/22 1121    Fransico Meadow, MD 04/03/22 1110

## 2022-03-28 NOTE — ED Notes (Signed)
RT note: Pt. up for d/c, v/s obtained/updated in chart.

## 2022-07-03 DIAGNOSIS — M461 Sacroiliitis, not elsewhere classified: Secondary | ICD-10-CM | POA: Diagnosis not present

## 2022-07-03 DIAGNOSIS — M533 Sacrococcygeal disorders, not elsewhere classified: Secondary | ICD-10-CM | POA: Diagnosis not present

## 2022-07-03 DIAGNOSIS — M545 Low back pain, unspecified: Secondary | ICD-10-CM | POA: Diagnosis not present

## 2022-07-03 DIAGNOSIS — G8929 Other chronic pain: Secondary | ICD-10-CM | POA: Diagnosis not present

## 2023-05-07 ENCOUNTER — Other Ambulatory Visit: Payer: Self-pay | Admitting: Medical Genetics

## 2023-05-07 DIAGNOSIS — Z006 Encounter for examination for normal comparison and control in clinical research program: Secondary | ICD-10-CM

## 2023-07-01 ENCOUNTER — Ambulatory Visit: Payer: Self-pay

## 2023-07-02 ENCOUNTER — Other Ambulatory Visit (HOSPITAL_COMMUNITY): Payer: Self-pay

## 2023-07-02 DIAGNOSIS — R051 Acute cough: Secondary | ICD-10-CM | POA: Diagnosis not present

## 2023-07-02 DIAGNOSIS — J069 Acute upper respiratory infection, unspecified: Secondary | ICD-10-CM | POA: Diagnosis not present

## 2023-07-02 MED ORDER — BENZONATATE 100 MG PO CAPS
100.0000 mg | ORAL_CAPSULE | Freq: Three times a day (TID) | ORAL | 0 refills | Status: AC | PRN
  Filled 2023-07-02: qty 21, 7d supply, fill #0

## 2023-07-02 MED ORDER — AMOXICILLIN-POT CLAVULANATE 875-125 MG PO TABS
1.0000 | ORAL_TABLET | Freq: Two times a day (BID) | ORAL | 0 refills | Status: AC
  Filled 2023-07-02: qty 14, 7d supply, fill #0

## 2024-03-01 DIAGNOSIS — Z23 Encounter for immunization: Secondary | ICD-10-CM | POA: Diagnosis not present

## 2024-03-01 DIAGNOSIS — E785 Hyperlipidemia, unspecified: Secondary | ICD-10-CM | POA: Diagnosis not present

## 2024-03-01 DIAGNOSIS — Z6830 Body mass index (BMI) 30.0-30.9, adult: Secondary | ICD-10-CM | POA: Diagnosis not present

## 2024-03-01 DIAGNOSIS — Z1159 Encounter for screening for other viral diseases: Secondary | ICD-10-CM | POA: Diagnosis not present

## 2024-03-01 DIAGNOSIS — Z0000 Encounter for general adult medical examination without abnormal findings: Secondary | ICD-10-CM | POA: Diagnosis not present

## 2024-03-01 DIAGNOSIS — E66811 Obesity, class 1: Secondary | ICD-10-CM | POA: Diagnosis not present

## 2024-03-01 DIAGNOSIS — Z124 Encounter for screening for malignant neoplasm of cervix: Secondary | ICD-10-CM | POA: Diagnosis not present

## 2024-03-15 DIAGNOSIS — E66811 Obesity, class 1: Secondary | ICD-10-CM | POA: Diagnosis not present

## 2024-03-15 DIAGNOSIS — Z6830 Body mass index (BMI) 30.0-30.9, adult: Secondary | ICD-10-CM | POA: Diagnosis not present

## 2024-03-15 DIAGNOSIS — Z713 Dietary counseling and surveillance: Secondary | ICD-10-CM | POA: Diagnosis not present

## 2024-04-01 DIAGNOSIS — R92323 Mammographic fibroglandular density, bilateral breasts: Secondary | ICD-10-CM | POA: Diagnosis not present

## 2024-04-01 DIAGNOSIS — Z1231 Encounter for screening mammogram for malignant neoplasm of breast: Secondary | ICD-10-CM | POA: Diagnosis not present

## 2024-04-08 DIAGNOSIS — Z713 Dietary counseling and surveillance: Secondary | ICD-10-CM | POA: Diagnosis not present

## 2024-04-08 DIAGNOSIS — E663 Overweight: Secondary | ICD-10-CM | POA: Diagnosis not present

## 2024-04-08 DIAGNOSIS — Z6829 Body mass index (BMI) 29.0-29.9, adult: Secondary | ICD-10-CM | POA: Diagnosis not present

## 2024-04-29 DIAGNOSIS — Z23 Encounter for immunization: Secondary | ICD-10-CM | POA: Diagnosis not present

## 2024-05-08 ENCOUNTER — Other Ambulatory Visit: Payer: Self-pay | Admitting: Medical Genetics

## 2024-05-08 DIAGNOSIS — Z006 Encounter for examination for normal comparison and control in clinical research program: Secondary | ICD-10-CM

## 2024-05-26 DIAGNOSIS — H16101 Unspecified superficial keratitis, right eye: Secondary | ICD-10-CM | POA: Diagnosis not present

## 2024-05-26 DIAGNOSIS — H10501 Unspecified blepharoconjunctivitis, right eye: Secondary | ICD-10-CM | POA: Diagnosis not present

## 2024-05-26 DIAGNOSIS — H16401 Unspecified corneal neovascularization, right eye: Secondary | ICD-10-CM | POA: Diagnosis not present

## 2024-06-09 DIAGNOSIS — H15111 Episcleritis periodica fugax, right eye: Secondary | ICD-10-CM | POA: Diagnosis not present

## 2024-06-29 DIAGNOSIS — Z23 Encounter for immunization: Secondary | ICD-10-CM | POA: Diagnosis not present
